# Patient Record
Sex: Female | Born: 1980 | Race: Black or African American | Hispanic: No | Marital: Single | Smoking: Former smoker
Health system: Southern US, Community
[De-identification: ages and names within clinical notes are randomized; demographics above are authoritative.]

---

## 2007-08-14 ENCOUNTER — Inpatient Hospital Stay (HOSPITAL_COMMUNITY): Admission: AD | Admit: 2007-08-14 | Discharge: 2007-08-21 | Payer: Self-pay | Admitting: *Deleted

## 2007-08-14 ENCOUNTER — Ambulatory Visit: Payer: Self-pay | Admitting: *Deleted
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
## 2011-04-01 LAB — COMPREHENSIVE METABOLIC PANEL
ALT: 10
CO2: 26
Calcium: 9.1
Creatinine, Ser: 0.98
GFR calc non Af Amer: >60
Glucose, Bld: 84
Sodium: 139
Total Protein: 6.6

## 2011-04-01 LAB — DRUGS OF ABUSE SCREEN W/O ALC, ROUTINE URINE
Benzodiazepines.: NEGATIVE
Cocaine Metabolites: NEGATIVE
Creatinine,U: 231.8
Marijuana Metabolite: POSITIVE — AB
Opiate Screen, Urine: NEGATIVE
Propoxyphene: NEGATIVE

## 2011-04-01 LAB — TSH: TSH: 1.2

## 2016-06-13 ENCOUNTER — Emergency Department
Admission: EM | Admit: 2016-06-13 | Discharge: 2016-06-13 | Disposition: A | Discharge status: Home / Self Care | Payer: Medicaid Other | Attending: Emergency Medicine | Admitting: Emergency Medicine

## 2016-06-13 ENCOUNTER — Emergency Department: Payer: Medicaid Other

## 2016-06-13 ENCOUNTER — Encounter: Payer: Self-pay | Admitting: Emergency Medicine

## 2016-06-13 DIAGNOSIS — O209 Hemorrhage in early pregnancy, unspecified: Secondary | ICD-10-CM | POA: Diagnosis present

## 2016-06-13 DIAGNOSIS — F1290 Cannabis use, unspecified, uncomplicated: Secondary | ICD-10-CM | POA: Diagnosis not present

## 2016-06-13 DIAGNOSIS — Z87891 Personal history of nicotine dependence: Secondary | ICD-10-CM | POA: Diagnosis not present

## 2016-06-13 DIAGNOSIS — Z3A10 10 weeks gestation of pregnancy: Secondary | ICD-10-CM | POA: Insufficient documentation

## 2016-06-13 DIAGNOSIS — N3000 Acute cystitis without hematuria: Secondary | ICD-10-CM | POA: Diagnosis not present

## 2016-06-13 DIAGNOSIS — O219 Vomiting of pregnancy, unspecified: Secondary | ICD-10-CM | POA: Insufficient documentation

## 2016-06-13 DIAGNOSIS — R102 Pelvic and perineal pain: Secondary | ICD-10-CM

## 2016-06-13 DIAGNOSIS — O200 Threatened abortion: Secondary | ICD-10-CM

## 2016-06-13 LAB — URINALYSIS COMPLETE WITH MICROSCOPIC (ARMC ONLY)
Bilirubin Urine: NEGATIVE
GLUCOSE, UA: NEGATIVE mg/dL
HGB URINE DIPSTICK: NEGATIVE
LEUKOCYTES UA: NEGATIVE
NITRITE: POSITIVE — AB
Protein, ur: 30 mg/dL — AB
SPECIFIC GRAVITY, URINE: 1.032 — AB (ref 1.005–1.030)
pH: 5 (ref 5.0–8.0)

## 2016-06-13 LAB — COMPREHENSIVE METABOLIC PANEL
ALT: 15 U/L (ref 14–54)
ANION GAP: 7 (ref 5–15)
AST: 14 U/L — ABNORMAL LOW (ref 15–41)
Albumin: 4.4 g/dL (ref 3.5–5.0)
Alkaline Phosphatase: 50 U/L (ref 38–126)
BILIRUBIN TOTAL: 0.7 mg/dL (ref 0.3–1.2)
BUN: 10 mg/dL (ref 6–20)
CO2: 23 mmol/L (ref 22–32)
Calcium: 9.4 mg/dL (ref 8.9–10.3)
Chloride: 104 mmol/L (ref 101–111)
Creatinine, Ser: 0.71 mg/dL (ref 0.44–1.00)
GFR calc Af Amer: >60 mL/min (ref >60)
Glucose, Bld: 87 mg/dL (ref 65–99)
POTASSIUM: 3.6 mmol/L (ref 3.5–5.1)
Sodium: 134 mmol/L — ABNORMAL LOW (ref 135–145)
TOTAL PROTEIN: 7.7 g/dL (ref 6.5–8.1)

## 2016-06-13 LAB — CBC
HEMATOCRIT: 38.4 % (ref 35.0–47.0)
HEMOGLOBIN: 13.5 g/dL (ref 12.0–16.0)
MCH: 31.7 pg (ref 26.0–34.0)
MCHC: 35.1 g/dL (ref 32.0–36.0)
MCV: 90.1 fL (ref 80.0–100.0)
Platelets: 241 10*3/uL (ref 150–440)
RBC: 4.26 MIL/uL (ref 3.80–5.20)
RDW: 13.5 % (ref 11.5–14.5)
WBC: 7.4 10*3/uL (ref 3.6–11.0)

## 2016-06-13 LAB — ABO/RH: ABO/RH(D): B POS

## 2016-06-13 LAB — HCG, QUANTITATIVE, PREGNANCY: HCG, BETA CHAIN, QUANT, S: 149022 m[IU]/mL — AB (ref <5)

## 2016-06-13 MED ORDER — SODIUM CHLORIDE 0.9 % IV SOLN
Freq: Once | INTRAVENOUS | Status: AC
  Administered 2016-06-13: 12:00:00 via INTRAVENOUS

## 2016-06-13 MED ORDER — ONDANSETRON HCL 4 MG/2ML IJ SOLN
4.0000 mg | Freq: Once | INTRAMUSCULAR | Status: AC
  Administered 2016-06-13: 4 mg via INTRAVENOUS
  Filled 2016-06-13: qty 2

## 2016-06-13 MED ORDER — NITROFURANTOIN MONOHYD MACRO 100 MG PO CAPS: 100.0000 mg | ORAL_CAPSULE | Freq: Two times a day (BID) | ORAL | 0 refills | Status: DC

## 2016-06-13 MED ORDER — ONDANSETRON 4 MG PO TBDP: 4.0000 mg | ORAL_TABLET | Freq: Three times a day (TID) | ORAL | 0 refills | Status: DC | PRN

## 2016-06-13 NOTE — ED Triage Notes (Signed)
Pt reports found out she was pregnant in October and since then has had lower abdominal pain, intermittent headaches, vaginal bleeding intermittently and nausea and vomitting. Pt reports no prenatal care yet, states she just moved here and was waiting on medicaid to make her first appointment.

## 2016-06-13 NOTE — ED Provider Notes (Signed)
[facility] Emergency Department Provider Note        Time seen: ----------------------------------------- 11:40 AM on 06/13/2016 -----------------------------------------    I have reviewed the triage vital signs and the nursing notes.   HISTORY  Chief Complaint Abdominal Pain; Vaginal Bleeding; Headache; Nausea; and Emesis    HPI Karina FreshwaterJoanna Doyle is a 35 y.o. female who presents to the ER forintractable vomiting. Patient states she is approximately [redacted] weeks pregnant with lower abdominal pain and intermittent vaginal spotting with nausea and vomiting. Patient states she's not had any prenatal care, she is G5 P4 AB 0. Patient states she just moved here and is waiting to get Medicaid for her first appointment.   History reviewed. No pertinent past medical history.  There are no active problems to display for this patient.   History reviewed. No pertinent surgical history.  Allergies Patient has no known allergies.  Social History Social History  Substance Use Topics  . Smoking status: Former Games developermoker  . Smokeless tobacco: Not on file  . Alcohol use No    Review of Systems Constitutional: Negative for fever. Cardiovascular: Negative for chest pain. Respiratory: Negative for shortness of breath. Gastrointestinal: Positive for abdominal pain, vomiting Genitourinary: Negative for dysuria.Positive for vaginal bleeding Musculoskeletal: Negative for back pain. Skin: Negative for rash. Neurological: Negative for headaches, focal weakness or numbness.  10-point ROS otherwise negative.  ____________________________________________   PHYSICAL EXAM:  VITAL SIGNS: ED Triage Vitals  Enc Vitals Group     BP 06/13/16 1029 (!) 103/35     Pulse Rate 06/13/16 1029 76     Resp 06/13/16 1029 20     Temp 06/13/16 1029 98.8 F (37.1 C)     Temp Source 06/13/16 1029 Oral     SpO2 06/13/16 1029 98 %     Weight 06/13/16 1030 150 lb (68 kg)     Height  06/13/16 1030 5\' 5"  (1.651 m)     Head Circumference --      Peak Flow --      Pain Score 06/13/16 1030 5     Pain Loc --      Pain Edu? --      Excl. in GC? --     Constitutional: Alert and oriented. Well appearing and in no distress. Eyes: Conjunctivae are normal. PERRL. Normal extraocular movements. ENT   Head: Normocephalic and atraumatic.   Nose: No congestion/rhinnorhea.   Mouth/Throat: Mucous membranes are moist.   Neck: No stridor. Cardiovascular: Normal rate, regular rhythm. No murmurs, rubs, or gallops. Respiratory: Normal respiratory effort without tachypnea nor retractions. Breath sounds are clear and equal bilaterally. No wheezes/rales/rhonchi. Gastrointestinal: Mild lower abdominal tenderness, no rebound or guarding. Normal bowel sounds. Musculoskeletal: Nontender with normal range of motion in all extremities. No lower extremity tenderness nor edema. Neurologic:  Normal speech and language. No gross focal neurologic deficits are appreciated.  Skin:  Skin is warm, dry and intact. No rash noted. Psychiatric: Mood and affect are normal. Speech and behavior are normal.  ____________________________________________  ED COURSE:  Pertinent labs & imaging results that were available during my care of the patient were reviewed by me and considered in my medical decision making (see chart for details). Clinical Course   Patient presents to the ER with abdominal pain and vomiting in pregnancy. We will assess with labs and ultrasound.  Procedures ____________________________________________   LABS (pertinent positives/negatives)  Labs Reviewed  COMPREHENSIVE METABOLIC PANEL - Abnormal; Notable for the following:  Result Value   Sodium 134 (*)    AST 14 (*)    All other components within normal limits  URINALYSIS COMPLETEWITH MICROSCOPIC ([facility] ONLY) - Abnormal; Notable for the following:    Color, Urine AMBER (*)    APPearance CLOUDY (*)    Ketones, ur  2+ (*)    Specific Gravity, Urine 1.032 (*)    Protein, ur 30 (*)    Nitrite POSITIVE (*)    Bacteria, UA MANY (*)    Squamous Epithelial / LPF 6-30 (*)    All other components within normal limits  HCG, QUANTITATIVE, PREGNANCY - Abnormal; Notable for the following:    hCG, Beta Chain, Quant, S [id number] (*)    All other components within normal limits  CBC  ABO/RH    RADIOLOGY  Pregnancy ultrasound IMPRESSION: 1. Single live intrauterine pregnancy of 10 weeks, 4 days . 2. No acute process identified. No evidence of ovarian torsion at this time. ____________________________________________  FINAL ASSESSMENT AND PLAN  Threatened miscarriage, cystitis  Plan: Patient with labs and imaging as dictated above. Patient is in no distress, feeling better after fluids and Zofran. She'll be discharged with Macrobid and Zofran and encouraged to have close follow-up with her doctor.   Emily FilbertWilliams, Ariea Rochin E, MD   Note: This dictation was prepared with Dragon dictation. Any transcriptional errors that result from this process are unintentional    Emily FilbertJonathan E Tatelyn Vanhecke, MD 06/13/16 1340

## 2016-06-13 NOTE — ED Notes (Signed)
Patient left for ultrasound.

## 2016-06-13 NOTE — ED Notes (Signed)
Patient is back from ultra sound.

## 2016-12-10 DIAGNOSIS — S4991XA Unspecified injury of right shoulder and upper arm, initial encounter: Secondary | ICD-10-CM | POA: Diagnosis not present

## 2016-12-10 DIAGNOSIS — S4992XA Unspecified injury of left shoulder and upper arm, initial encounter: Secondary | ICD-10-CM | POA: Diagnosis not present

## 2016-12-10 DIAGNOSIS — S8992XA Unspecified injury of left lower leg, initial encounter: Secondary | ICD-10-CM | POA: Diagnosis not present

## 2016-12-10 DIAGNOSIS — S299XXA Unspecified injury of thorax, initial encounter: Secondary | ICD-10-CM | POA: Diagnosis not present

## 2017-09-30 ENCOUNTER — Emergency Department
Admission: EM | Admit: 2017-09-30 | Discharge: 2017-10-02 | Disposition: A | Discharge status: Home / Self Care | Payer: Medicaid Other | Attending: Emergency Medicine | Admitting: Emergency Medicine

## 2017-09-30 ENCOUNTER — Other Ambulatory Visit: Payer: Self-pay

## 2017-09-30 ENCOUNTER — Encounter: Payer: Self-pay | Admitting: *Deleted

## 2017-09-30 DIAGNOSIS — Z87891 Personal history of nicotine dependence: Secondary | ICD-10-CM | POA: Diagnosis not present

## 2017-09-30 DIAGNOSIS — F4325 Adjustment disorder with mixed disturbance of emotions and conduct: Secondary | ICD-10-CM | POA: Diagnosis not present

## 2017-09-30 DIAGNOSIS — N3001 Acute cystitis with hematuria: Secondary | ICD-10-CM | POA: Diagnosis not present

## 2017-09-30 DIAGNOSIS — R451 Restlessness and agitation: Secondary | ICD-10-CM | POA: Diagnosis present

## 2017-09-30 DIAGNOSIS — F1220 Cannabis dependence, uncomplicated: Secondary | ICD-10-CM | POA: Diagnosis present

## 2017-09-30 DIAGNOSIS — Z79899 Other long term (current) drug therapy: Secondary | ICD-10-CM | POA: Insufficient documentation

## 2017-09-30 LAB — COMPREHENSIVE METABOLIC PANEL
ALT: 32 U/L (ref 14–54)
AST: 25 U/L (ref 15–41)
Albumin: 5.1 g/dL — ABNORMAL HIGH (ref 3.5–5.0)
Alkaline Phosphatase: 107 U/L (ref 38–126)
Anion gap: 14 (ref 5–15)
BUN: 18 mg/dL (ref 6–20)
CHLORIDE: 107 mmol/L (ref 101–111)
CO2: 19 mmol/L — AB (ref 22–32)
CREATININE: 1.14 mg/dL — AB (ref 0.44–1.00)
Calcium: 9.8 mg/dL (ref 8.9–10.3)
GFR calc Af Amer: >60 mL/min (ref >60)
Glucose, Bld: 115 mg/dL — ABNORMAL HIGH (ref 65–99)
Potassium: 3.6 mmol/L (ref 3.5–5.1)
Sodium: 140 mmol/L (ref 135–145)
Total Bilirubin: 1 mg/dL (ref 0.3–1.2)
Total Protein: 9.7 g/dL — ABNORMAL HIGH (ref 6.5–8.1)

## 2017-09-30 LAB — CBC
HEMATOCRIT: 43.2 % (ref 35.0–47.0)
Hemoglobin: 14.8 g/dL (ref 12.0–16.0)
MCH: 30.6 pg (ref 26.0–34.0)
MCHC: 34.2 g/dL (ref 32.0–36.0)
MCV: 89.3 fL (ref 80.0–100.0)
Platelets: 346 10*3/uL (ref 150–440)
RBC: 4.84 MIL/uL (ref 3.80–5.20)
RDW: 13.5 % (ref 11.5–14.5)
WBC: 8.8 10*3/uL (ref 3.6–11.0)

## 2017-09-30 LAB — URINE DRUG SCREEN, QUALITATIVE (ARMC ONLY)
Amphetamines, Ur Screen: NOT DETECTED
BARBITURATES, UR SCREEN: NOT DETECTED
Benzodiazepine, Ur Scrn: NOT DETECTED
CANNABINOID 50 NG, UR ~~LOC~~: POSITIVE — AB
Cocaine Metabolite,Ur ~~LOC~~: NOT DETECTED
MDMA (Ecstasy)Ur Screen: NOT DETECTED
METHADONE SCREEN, URINE: NOT DETECTED
OPIATE, UR SCREEN: NOT DETECTED
Phencyclidine (PCP) Ur S: NOT DETECTED
Tricyclic, Ur Screen: NOT DETECTED

## 2017-09-30 LAB — POCT PREGNANCY, URINE: Preg Test, Ur: NEGATIVE

## 2017-09-30 LAB — ETHANOL

## 2017-09-30 LAB — SALICYLATE LEVEL

## 2017-09-30 LAB — ACETAMINOPHEN LEVEL
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
## 2017-10-01 LAB — URINALYSIS, COMPLETE (UACMP) WITH MICROSCOPIC
Bilirubin Urine: NEGATIVE
GLUCOSE, UA: NEGATIVE mg/dL
KETONES UR: 20 mg/dL — AB
NITRITE: POSITIVE — AB
PROTEIN: 100 mg/dL — AB
Specific Gravity, Urine: 1.021 (ref 1.005–1.030)
pH: 5 (ref 5.0–8.0)

## 2017-10-01 MED ORDER — GABAPENTIN 100 MG PO CAPS
200.0000 mg | ORAL_CAPSULE | Freq: Three times a day (TID) | ORAL | Status: DC
  Administered 2017-10-01 – 2017-10-02 (×3): 200 mg via ORAL
  Filled 2017-10-01 (×3): qty 2

## 2017-10-01 NOTE — ED Notes (Signed)
Report to include Situation, Background, Assessment, and Recommendations received from Amy B. RN. Patient alert and oriented, warm and dry, in no acute distress. Patient denies SI, HI, AVH and pain. Patient made aware of Q15 minute rounds and security cameras for their safety. Patient instructed to come to me with needs or concerns. 

## 2017-10-01 NOTE — ED Notes (Signed)

## 2017-10-01 NOTE — ED Notes (Signed)
Hourly rounding reveals patient in room. No complaints, stable, in no acute distress. Q15 minute rounds and monitoring via Security Cameras to continue.Snack and beverage given. 

## 2017-10-01 NOTE — ED Notes (Signed)
Hourly rounding reveals patient sleeping in room. No complaints, stable, in no acute distress. Q15 minute rounds and monitoring via Security Cameras to continue. 

## 2017-10-01 NOTE — ED Notes (Signed)
Pt speaking on the phone in the dayroom. Maintained on 15 minute checks and observation by security camera for safety.

## 2017-10-01 NOTE — ED Notes (Signed)
IVC 

## 2017-10-01 NOTE — ED Notes (Signed)
Pt tearful when speaking to this Clinical research associatewriter. Pt denies SI/HI. 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 Pt believes her life is over. 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 This will be on my record. Everyone thinks I am disgusting."  Pt is concerned about leaving her boyfriend because she will have to care for 5 children on her own. Maintained on 15 minute checks and observation by security camera for safety.

## 2017-10-01 NOTE — ED Notes (Addendum)
Pt angry her pain from a c-section scar had not been addressed. Pt did not inform this writer of any pain. RN to speak with EDP about ordering gabapentin (PTA med). Pt also wanting her discharge paperwork. RN explained she was under IVC and only the doctor could reverse this and allow her to leave. Pt continued to tell RN she was going to get her clothes and leave. "Who is going to stop me? What are they gonna do?"   Pt demanding to speak with someone else about her discharge.   Maintained on 15 minute checks and observation by security camera for safety.

## 2017-10-01 NOTE — ED Provider Notes (Signed)
-----------------------------------------   3:45 AM on 10/01/2017 -----------------------------------------  [facility]'s [facility].  No medication recommendations at this time.   Irean HongSung, Stephaniemarie Stoffel J, MD 10/01/17 [phone]

## 2017-10-01 NOTE — ED Notes (Signed)
Hourly rounding reveals patient in room. No complaints, stable, in no acute distress. Q15 minute rounds and monitoring via Security Cameras to continue. 

## 2017-10-01 NOTE — ED Notes (Signed)
Pt in shower at this time, meal tray and juice placed on bed.
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
## 2017-10-02 ENCOUNTER — Encounter: Payer: Self-pay | Admitting: Psychiatry

## 2017-10-02 DIAGNOSIS — F4325 Adjustment disorder with mixed disturbance of emotions and conduct: Secondary | ICD-10-CM

## 2017-10-02 DIAGNOSIS — F1220 Cannabis dependence, uncomplicated: Secondary | ICD-10-CM | POA: Diagnosis present

## 2017-10-02 MED ORDER — CEPHALEXIN 500 MG PO CAPS
500.0000 mg | ORAL_CAPSULE | Freq: Four times a day (QID) | ORAL | Status: DC
  Administered 2017-10-02 (×2): 500 mg via ORAL
  Filled 2017-10-02 (×3): qty 1

## 2017-10-02 MED ORDER — CEPHALEXIN 500 MG PO CAPS: 500.0000 mg | ORAL_CAPSULE | Freq: Three times a day (TID) | ORAL | 0 refills | Status: AC

## 2017-10-02 NOTE — ED Provider Notes (Signed)
-----------------------------------------   2:24 PM on 10/02/2017 -----------------------------------------   Blood pressure 119/69, pulse 88, temperature 98.4 F (36.9 C), temperature source Oral, resp. rate 16, height 5\' 5"  (1.651 m), weight 89.8 kg (198 lb), SpO2 100 %, unknown if currently breastfeeding.  Patient has been evaluated by psychiatry and cleared for discharge. IVC lifted by Dr. Jennet MaduroPucilowska. Patient's labs have been reviewed with no acute findings. Patient will be discharged at this time to home     Don PerkingVeronese, [place], MD 10/02/17 1424

## 2017-10-02 NOTE — ED Notes (Signed)
Hourly rounding reveals patient sleeping in room. No complaints, stable, in no acute distress. Q15 minute rounds and monitoring via Security Cameras to continue. 

## 2017-10-02 NOTE — ED Notes (Signed)
Hourly rounding reveals patient in room. No complaints, stable, in no acute distress. Q15 minute rounds and monitoring via Security Cameras to continue. 

## 2017-10-02 NOTE — Discharge Instructions (Addendum)
You have been seen in the Emergency Department (ED)  today for a psychiatric complaint.  You have been evaluated by psychiatry and we believe you are safe to be discharged from the hospital.   ° °Please return to the Emergency Department (ED)  immediately if you have ANY thoughts of hurting yourself or anyone else, so that we may help you. ° °Please avoid alcohol and drug use. ° °Follow up with your doctor and/or therapist as soon as possible regarding today's ED  visit.  ° °You may call crisis hotline for [place] at [phone]. ° °
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
## 2017-10-02 NOTE — ED Notes (Signed)
Pt taking shower now

## 2017-10-02 NOTE — ED Notes (Signed)
Hourly rounding reveals patient in rest room. No complaints, stable, in no acute distress. Q15 minute rounds and monitoring via Security Cameras to continue. 
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
## 2017-10-02 NOTE — ED Notes (Addendum)
Pt. Requested and given water.

## 2017-10-02 NOTE — ED Notes (Signed)
IVC  RESCINDED  INFORMED  [facility])

## 2017-10-02 NOTE — ED Notes (Signed)
Patient voices understanding of discharge instructions, prescription given for ABT.for UTI. Patient received all of her belongings, Patient has family member that is going to pick her up and transfer home.

## 2017-10-02 NOTE — ED Provider Notes (Signed)
-----------------------------------------   8:30 AM on 10/02/2017 -----------------------------------------   Blood pressure 119/69, pulse 88, temperature 98.4 F (36.9 C), temperature source Oral, resp. rate 16, height 5\' 5"  (1.651 m), weight 89.8 kg (198 lb), SpO2 100 %, unknown if currently breastfeeding.  The patient had no acute events since last update.  Calm and cooperative at this time.  Disposition is pending per Psychiatry/Behavioral Medicine team recommendations.     Don PerkingVeronese, [place], MD 10/02/17 0830

## 2017-10-02 NOTE — Consult Note (Signed)
  [name] was brought to the ER for suicidal and homicidal ideation. She is no longer a danger to herself or others.  Full consult to follow. Case discussed with Dr. Don PerkingVeronese  PLAN: 1. Please discharge as appropriate 2. No medications recommended. 3/ Pt not interested in follow up with mental health professional.

## 2017-10-04 LAB — URINE CULTURE: Culture: >=100000 — AB

## 2017-10-19 DIAGNOSIS — B977 Papillomavirus as the cause of diseases classified elsewhere: Secondary | ICD-10-CM | POA: Diagnosis not present

## 2017-10-19 DIAGNOSIS — R8789 Other abnormal findings in specimens from female genital organs: Secondary | ICD-10-CM | POA: Diagnosis not present

## 2017-12-16 IMAGING — US US ART/VEN ABD/PELV/SCROTUM DOPPLER LTD
1 series · 13 of 25 positions shown · non-contrast
Comparison: None.

CLINICAL DATA: 35 y/o  F; pelvic pain and bleeding.



[Series 1: us art/ven abd/pelv/scrotum doppler ltd · 0.20mm/px · 13 of 77 slices shown]
[im 1/77]
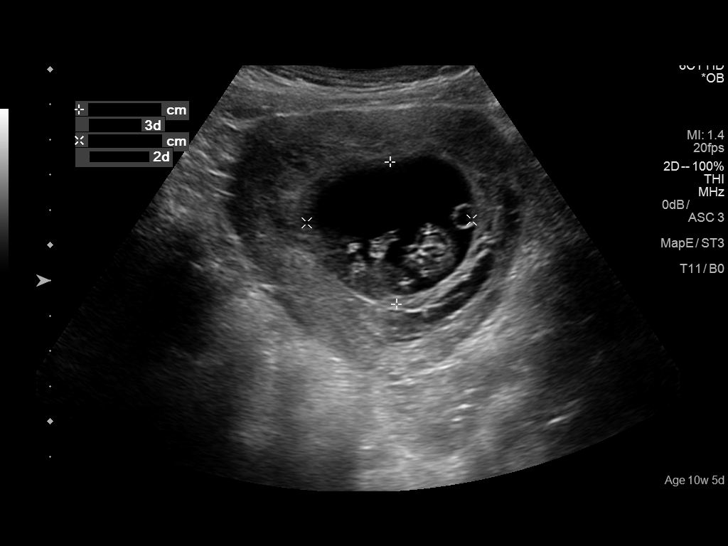
[im 7/77]
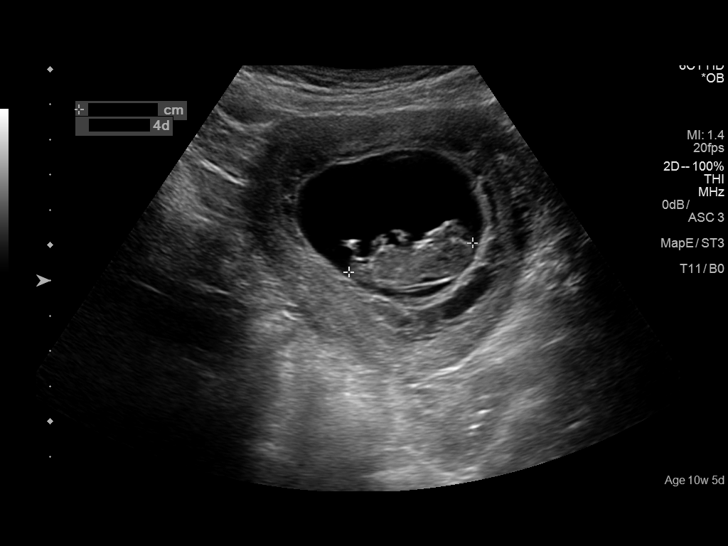
[im 13/77]
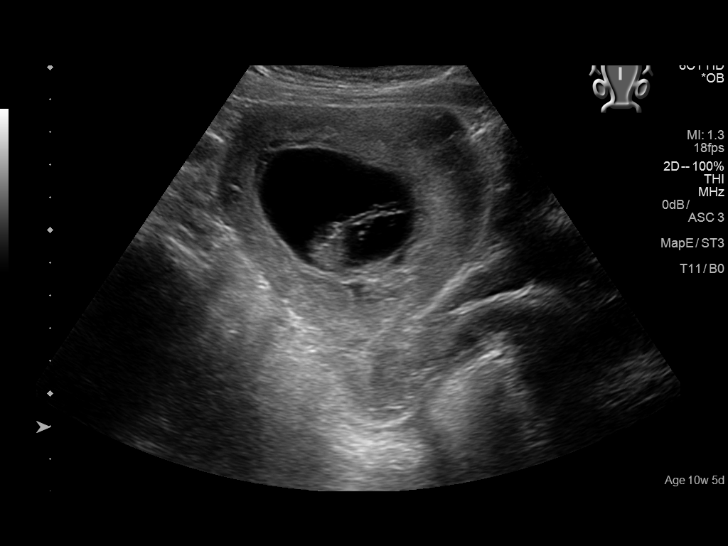
[im 20/77]
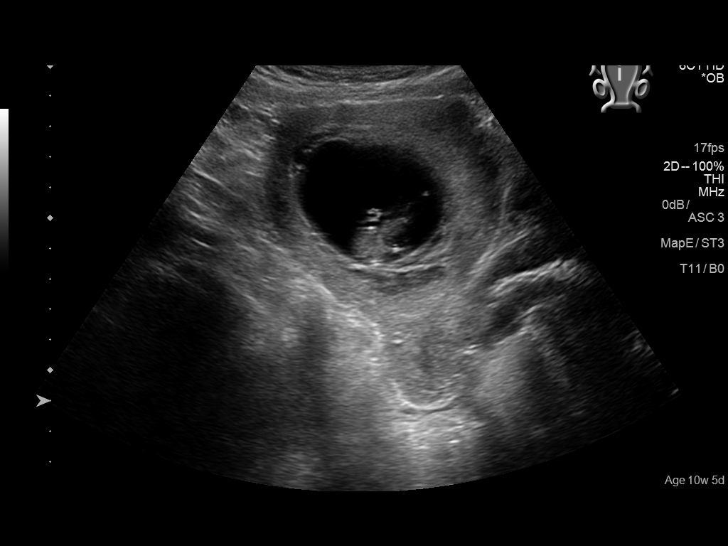
[im 26/77]
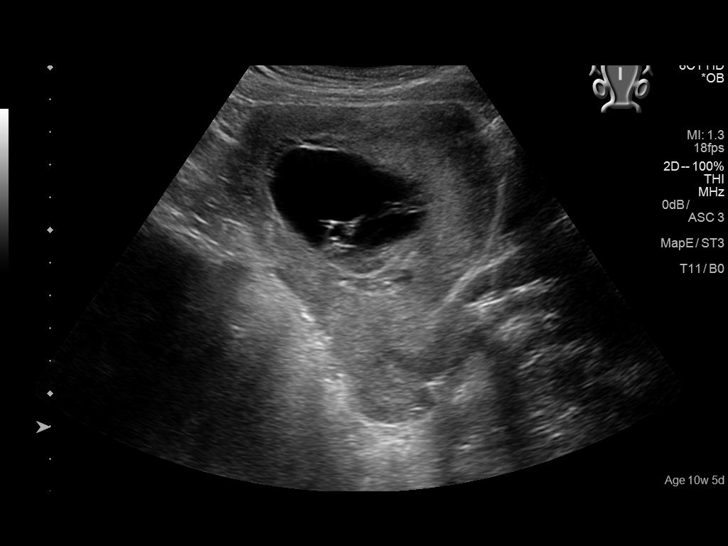
[im 32/77]
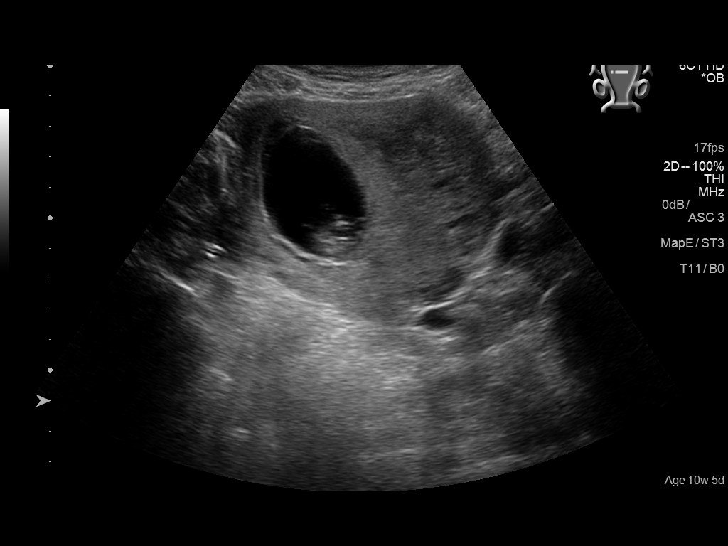
[im 39/77]
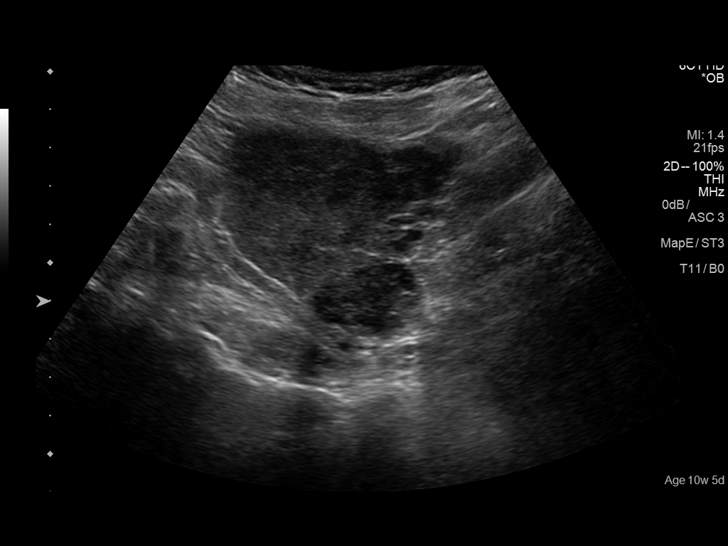
[im 45/77]
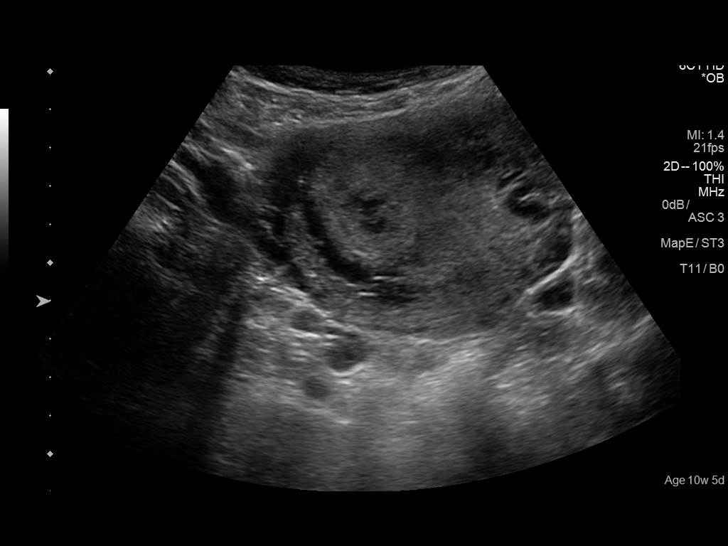
[im 51/77]
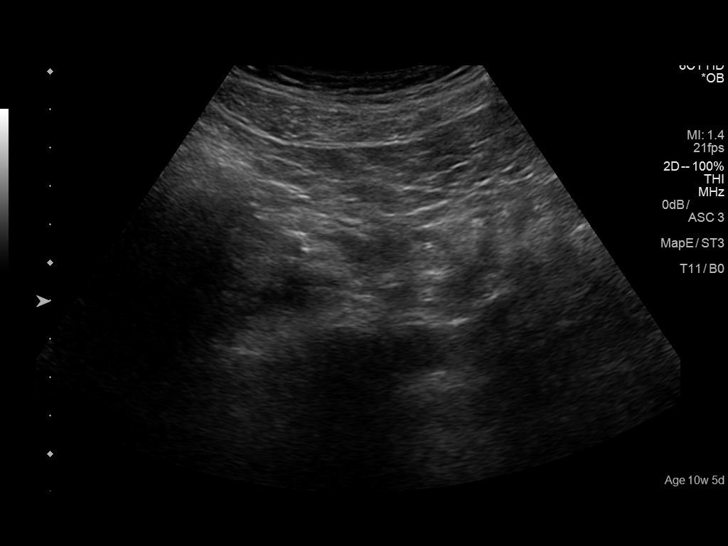
[im 58/77]
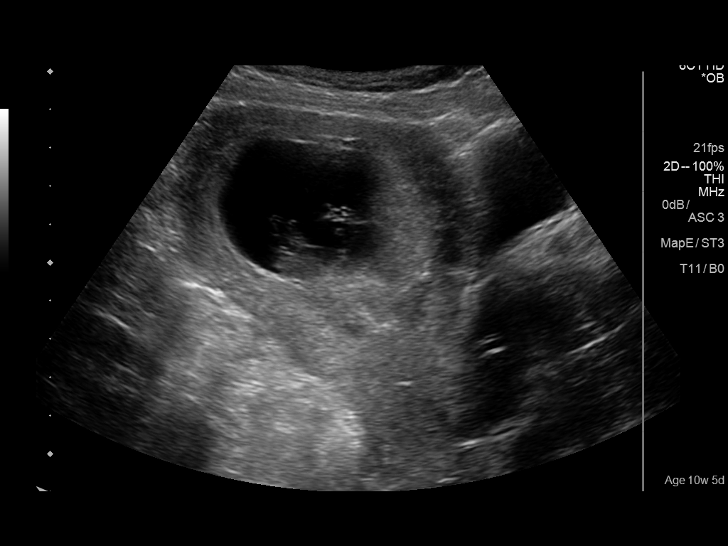
[im 64/77]
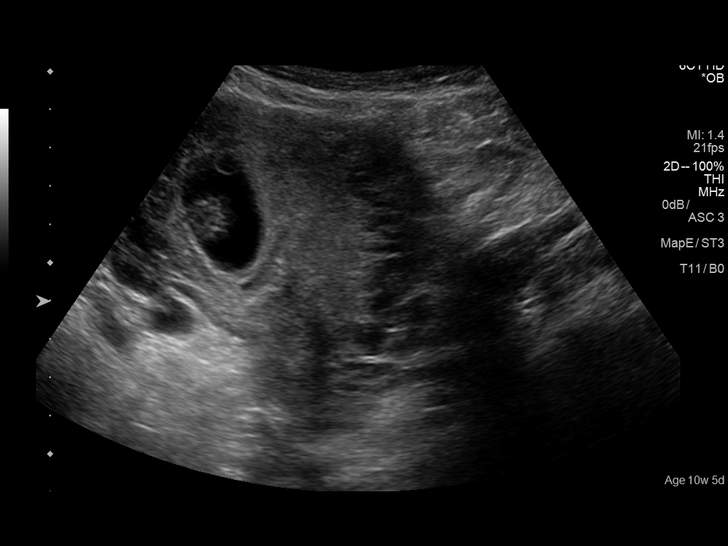
[im 70/77]
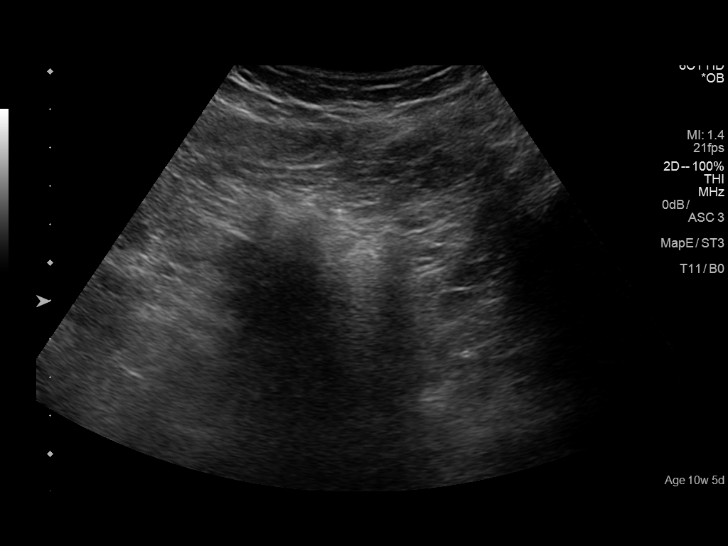
[im 77/77]
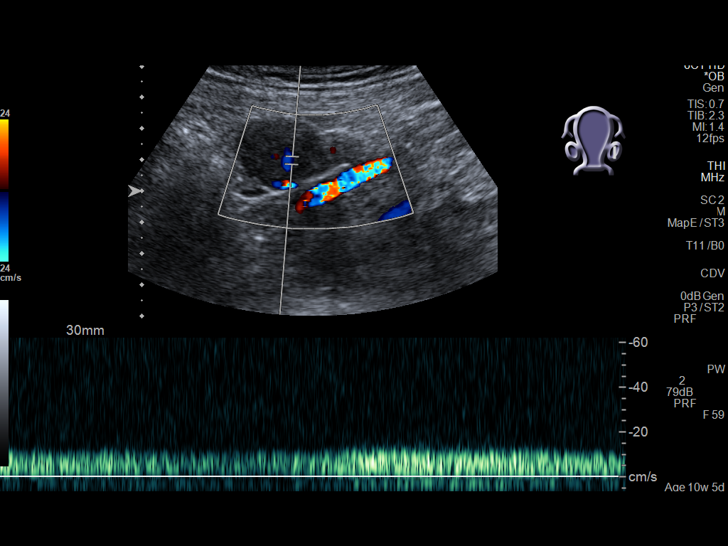

[13 of 25 positions shown; findings below may reference images not displayed]

FINDINGS: Intrauterine gestational sac: Single

Yolk sac:  Visualized.

Embryo:  Visualized.

Cardiac Activity: Visualized.

Heart Rate: 155 bpm

CRL:   36.2  mm   10 w 4 d                  US EDC: 01/05/2017

Subchorionic hemorrhage:  None visualized.

Maternal uterus/adnexae: Bilateral ovaries are normal with the right
measuring 2.6 x 1.4 x 2.2 cm and the left measuring 3.5 x 2.5 x
cm.

Pulsed Doppler evaluation of both ovaries demonstrates normal
appearing low-resistance arterial and venous waveforms.
IMPRESSION: 1. Single live intrauterine pregnancy.
2. No acute process identified. No evidence of ovarian torsion at
this time.

By: Ebadat Tiger M.D.

## 2018-03-13 DIAGNOSIS — Z3042 Encounter for surveillance of injectable contraceptive: Secondary | ICD-10-CM | POA: Diagnosis not present

## 2018-04-20 DIAGNOSIS — Z23 Encounter for immunization: Secondary | ICD-10-CM | POA: Diagnosis not present

## 2018-05-29 DIAGNOSIS — Z3042 Encounter for surveillance of injectable contraceptive: Secondary | ICD-10-CM | POA: Diagnosis not present

## 2018-08-16 DIAGNOSIS — Z3042 Encounter for surveillance of injectable contraceptive: Secondary | ICD-10-CM | POA: Diagnosis not present

## 2018-11-06 DIAGNOSIS — Z3042 Encounter for surveillance of injectable contraceptive: Secondary | ICD-10-CM | POA: Diagnosis not present

## 2018-11-23 ENCOUNTER — Other Ambulatory Visit: Payer: Self-pay

## 2018-11-23 ENCOUNTER — Encounter: Payer: Self-pay | Admitting: Emergency Medicine

## 2018-11-23 ENCOUNTER — Emergency Department
Admission: EM | Admit: 2018-11-23 | Discharge: 2018-11-23 | Disposition: A | Discharge status: Home / Self Care | Payer: Medicaid Other | Attending: Emergency Medicine | Admitting: Emergency Medicine

## 2018-11-23 DIAGNOSIS — Z87891 Personal history of nicotine dependence: Secondary | ICD-10-CM | POA: Insufficient documentation

## 2018-11-23 DIAGNOSIS — R1030 Lower abdominal pain, unspecified: Secondary | ICD-10-CM | POA: Insufficient documentation

## 2018-11-23 LAB — URINALYSIS, COMPLETE (UACMP) WITH MICROSCOPIC
Bacteria, UA: NONE SEEN
Bilirubin Urine: NEGATIVE
Glucose, UA: NEGATIVE mg/dL
Hgb urine dipstick: NEGATIVE
Ketones, ur: NEGATIVE mg/dL
Leukocytes,Ua: NEGATIVE
Nitrite: NEGATIVE
Protein, ur: NEGATIVE mg/dL
Specific Gravity, Urine: 1.025 (ref 1.005–1.030)
pH: 5 (ref 5.0–8.0)

## 2018-11-23 LAB — COMPREHENSIVE METABOLIC PANEL
ALT: 12 U/L (ref 0–44)
AST: 14 U/L — ABNORMAL LOW (ref 15–41)
Albumin: 4.6 g/dL (ref 3.5–5.0)
Alkaline Phosphatase: 72 U/L (ref 38–126)
Anion gap: 12 (ref 5–15)
BUN: 8 mg/dL (ref 6–20)
CO2: 19 mmol/L — ABNORMAL LOW (ref 22–32)
Calcium: 9.4 mg/dL (ref 8.9–10.3)
Chloride: 109 mmol/L (ref 98–111)
Creatinine, Ser: 0.96 mg/dL (ref 0.44–1.00)
GFR calc Af Amer: >60 mL/min (ref >60)
GFR calc non Af Amer: >60 mL/min (ref >60)
Glucose, Bld: 99 mg/dL (ref 70–99)
Potassium: 3.8 mmol/L (ref 3.5–5.1)
Sodium: 140 mmol/L (ref 135–145)
Total Bilirubin: 0.5 mg/dL (ref 0.3–1.2)
Total Protein: 7.5 g/dL (ref 6.5–8.1)

## 2018-11-23 LAB — CBC
HCT: 39.9 % (ref 36.0–46.0)
Hemoglobin: 14 g/dL (ref 12.0–15.0)
MCH: 31.4 pg (ref 26.0–34.0)
MCHC: 35.1 g/dL (ref 30.0–36.0)
MCV: 89.5 fL (ref 80.0–100.0)
Platelets: 249 10*3/uL (ref 150–400)
RBC: 4.46 MIL/uL (ref 3.87–5.11)
RDW: 13.2 % (ref 11.5–15.5)
WBC: 5.4 10*3/uL (ref 4.0–10.5)
nRBC: 0 % (ref 0.0–0.2)

## 2018-11-23 LAB — POCT PREGNANCY, URINE: Preg Test, Ur: NEGATIVE

## 2018-11-23 LAB — LIPASE, BLOOD: Lipase: 25 U/L (ref 11–51)

## 2018-11-23 NOTE — Discharge Instructions (Signed)
Your exam and labs are reassuring at this time. You may be experiencing local pain and sensitivity at your previous C-section scar due to adhesions or hypersensitive nerves. You have no indication of a hernia, infection, or wound opening. Take OTC ibuprofen or Tylenol as needed. Follow-up with your primary care provider as needed.

## 2018-11-23 NOTE — ED Notes (Signed)
Pt left exam room without signing signature pad

## 2018-11-23 NOTE — ED Triage Notes (Signed)
Patient presents to the ED after a fall yesterday.  Patient states she was carrying a box and fell and the box hit her c-section scar.  Patient reports severe cramping at the site of the scar.  Patient states she has had pain to area ever since her c-section 2 years ago.  Patient states the pain yesterday lasted for several hours.  Patient states now her abdomen is cramping intermittently (more to the right side).  Patient states pain is radiating down right leg.  Patient denies vomiting and diarrhea.  Patient states abdomen is tender to touch.

## 2018-11-23 NOTE — ED Notes (Signed)
Pt reports she had a C-section about 2 years ago and reports she feels she never heal well, reports yesterday she was moving some items and feel on her lower abdomen reports now has pain feels like cramps and muscles spams, pt talks in complete sentences no distress noted

## 2018-11-24 NOTE — ED Provider Notes (Signed)
[facility] Emergency Department Provider Note ____________________________________________  Time seen: 71  I have reviewed the triage vital signs and the nursing notes.  HISTORY  Chief Complaint  Fall  HPI [name] is a 38 y.o. female presents herself to the ED for evaluation of pain to the lower abdomen at her C-section scar. She describes carrying a box yesterday and she fell, causing the box to hit her across the lower abdomen at the scar. She notes cramping at the scar, but denies nausea, vomiting, or diarrhea. She is also without any laceration or abrasion to the scar. She reports ongoing scar tenderness since her c-section, 2 years ago.   History reviewed. No pertinent past medical history.  Patient Active Problem List   Diagnosis Date Noted  . 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 Cannabis use disorder, moderate, dependence (HCC) 10/02/2017    History reviewed. No pertinent surgical history.  Prior to Admission medications   Not on File    Allergies Sertraline hcl  No family history on file.  Social History Social History   Tobacco Use  . Smoking status: Former Games developer  . Smokeless tobacco: Never Used  Substance Use Topics  . Alcohol use: No  . Drug use: Yes    Types: Marijuana    Review of Systems  Constitutional: Negative for fever. Eyes: Negative for visual changes. ENT: Negative for sore throat. Cardiovascular: Negative for chest pain. Respiratory: Negative for shortness of breath. Gastrointestinal: Negative for abdominal pain, vomiting and diarrhea. Reports abdominal wall pain as above.  Genitourinary: Negative for dysuria. Musculoskeletal: Negative for back pain. Skin: Negative for rash. Neurological: Negative for headaches, focal weakness or numbness. ____________________________________________  PHYSICAL EXAM:  VITAL SIGNS: ED Triage Vitals  Enc Vitals Group     BP  11/23/18 1328 123/71     Pulse Rate 11/23/18 1328 96     Resp 11/23/18 1804 16     Temp 11/23/18 1328 98.9 F (37.2 C)     Temp Source 11/23/18 1328 Oral     SpO2 11/23/18 1328 98 %     Weight 11/23/18 1330 170 lb (77.1 kg)     Height 11/23/18 1330 5\' 5"  (1.651 m)     Head Circumference --      Peak Flow --      Pain Score 11/23/18 1334 8     Pain Loc --      Pain Edu? --      Excl. in GC? --     Constitutional: Alert and oriented. Well appearing and in no distress. Head: Normocephalic and atraumatic. Eyes: Conjunctivae are normal. Normal extraocular movements Cardiovascular: Normal rate, regular rhythm. Normal distal pulses. Respiratory: Normal respiratory effort. No wheezes/rales/rhonchi. Gastrointestinal: Soft and nontender. No distention, rebound, guarding, or rigidity. Well-healed, flat, thin suprapubic c-section scar without hypertrophy or keloid. Mild tender to palpation over the scar line. No signs of redness, warmth, induration, or dehiscence. No bruising or ecchymosis noted to the abdominal wall.  Musculoskeletal: Nontender with normal range of motion in all extremities.  Neurologic:  Normal gait without ataxia. Normal speech and language. No gross focal neurologic deficits are appreciated. Skin:  Skin is warm, dry and intact. No rash noted. Psychiatric: Mood and affect are normal. Patient exhibits appropriate insight and judgment. ____________________________________________   LABS (pertinent positives/negatives) Labs Reviewed  COMPREHENSIVE METABOLIC PANEL - Abnormal; Notable for the following components:      Result Value   CO2 19 (*)  AST 14 (*)    All other components within normal limits  URINALYSIS, COMPLETE (UACMP) WITH MICROSCOPIC - Abnormal; Notable for the following components:   Color, Urine YELLOW (*)    APPearance HAZY (*)    All other components within normal limits  LIPASE, BLOOD  CBC  POCT PREGNANCY, URINE   ____________________________________________  PROCEDURES  Procedures ____________________________________________  INITIAL IMPRESSION / ASSESSMENT AND PLAN / ED COURSE  Ronnell FreshwaterJoanna Virden was evaluated in Emergency Department on 11/24/2018 for the symptoms described in the history of present illness. She was evaluated in the context of the global COVID-19 pandemic, which necessitated consideration that the patient might be at risk for infection with the SARS-CoV-2 virus that causes COVID-19. Institutional protocols and algorithms that pertain to the evaluation of patients at risk for COVID-19 are in a state of rapid change based on information released by regulatory bodies including the CDC and federal and state organizations. These policies and algorithms were followed during the patient's care in the ED.  Patient with ED evaluation of some mild pain to the lower abdomen at her surgical scar.  Patient describes blunt trauma to the lower area yesterday.  She reports intermittent symptoms described as cramping.  Exam is overall benign at this time.  Labs are without any acute infectious process, or any indication of acute abdominal process on exam.  Patient is discharged at this time [facility].  Possible etiologies for this pain could be underlying adhesions as well as a possible nerve entrapment.  The surgical scar itself is very fine and will heal without any signs of hypertrophy or keloid.  No signs of any local infection are appreciated.  She will follow-up with primary provider as discussed. ____________________________________________  FINAL CLINICAL IMPRESSION(S) / ED DIAGNOSES  Final diagnoses:  Abdominal wall pain in suprapubic region      Lissa HoardMenshew, Chyrl Elwell V Bacon, PA-C 11/24/18 0116    Jeanmarie PlantMcShane, James A, MD 11/24/18 2317

## 2019-01-15 DIAGNOSIS — Z124 Encounter for screening for malignant neoplasm of cervix: Secondary | ICD-10-CM | POA: Diagnosis not present

## 2019-01-22 DIAGNOSIS — Z3042 Encounter for surveillance of injectable contraceptive: Secondary | ICD-10-CM | POA: Diagnosis not present

## 2019-03-11 DIAGNOSIS — F419 Anxiety disorder, unspecified: Secondary | ICD-10-CM | POA: Diagnosis not present

## 2019-03-11 DIAGNOSIS — Z01419 Encounter for gynecological examination (general) (routine) without abnormal findings: Secondary | ICD-10-CM | POA: Diagnosis not present

## 2019-04-09 DIAGNOSIS — Z3042 Encounter for surveillance of injectable contraceptive: Secondary | ICD-10-CM | POA: Diagnosis not present

## 2019-07-16 DIAGNOSIS — Z3042 Encounter for surveillance of injectable contraceptive: Secondary | ICD-10-CM | POA: Diagnosis not present

## 2019-07-19 DIAGNOSIS — Z23 Encounter for immunization: Secondary | ICD-10-CM | POA: Diagnosis not present

## 2019-07-19 DIAGNOSIS — Z111 Encounter for screening for respiratory tuberculosis: Secondary | ICD-10-CM | POA: Diagnosis not present

## 2019-11-05 DIAGNOSIS — Z3042 Encounter for surveillance of injectable contraceptive: Secondary | ICD-10-CM | POA: Diagnosis not present

## 2020-05-18 DIAGNOSIS — Z01419 Encounter for gynecological examination (general) (routine) without abnormal findings: Secondary | ICD-10-CM | POA: Diagnosis not present

## 2020-05-18 DIAGNOSIS — Z113 Encounter for screening for infections with a predominantly sexual mode of transmission: Secondary | ICD-10-CM | POA: Diagnosis not present

## 2020-06-10 DIAGNOSIS — Z419 Encounter for procedure for purposes other than remedying health state, unspecified: Secondary | ICD-10-CM | POA: Diagnosis not present

## 2020-07-11 DIAGNOSIS — Z419 Encounter for procedure for purposes other than remedying health state, unspecified: Secondary | ICD-10-CM | POA: Diagnosis not present

## 2020-10-09 DIAGNOSIS — Z419 Encounter for procedure for purposes other than remedying health state, unspecified: Secondary | ICD-10-CM | POA: Diagnosis not present

## 2020-10-27 DIAGNOSIS — Z3042 Encounter for surveillance of injectable contraceptive: Secondary | ICD-10-CM | POA: Diagnosis not present

## 2020-11-08 DIAGNOSIS — Z419 Encounter for procedure for purposes other than remedying health state, unspecified: Secondary | ICD-10-CM | POA: Diagnosis not present

## 2020-12-09 DIAGNOSIS — Z419 Encounter for procedure for purposes other than remedying health state, unspecified: Secondary | ICD-10-CM | POA: Diagnosis not present

## 2021-01-08 DIAGNOSIS — Z419 Encounter for procedure for purposes other than remedying health state, unspecified: Secondary | ICD-10-CM | POA: Diagnosis not present

## 2021-01-12 DIAGNOSIS — Z3042 Encounter for surveillance of injectable contraceptive: Secondary | ICD-10-CM | POA: Diagnosis not present

## 2021-02-01 DIAGNOSIS — Z23 Encounter for immunization: Secondary | ICD-10-CM | POA: Diagnosis not present

## 2021-02-08 DIAGNOSIS — Z419 Encounter for procedure for purposes other than remedying health state, unspecified: Secondary | ICD-10-CM | POA: Diagnosis not present

## 2021-03-11 DIAGNOSIS — Z419 Encounter for procedure for purposes other than remedying health state, unspecified: Secondary | ICD-10-CM | POA: Diagnosis not present

## 2021-03-30 DIAGNOSIS — Z3042 Encounter for surveillance of injectable contraceptive: Secondary | ICD-10-CM | POA: Diagnosis not present

## 2021-04-10 DIAGNOSIS — Z419 Encounter for procedure for purposes other than remedying health state, unspecified: Secondary | ICD-10-CM | POA: Diagnosis not present

## 2021-05-11 DIAGNOSIS — Z419 Encounter for procedure for purposes other than remedying health state, unspecified: Secondary | ICD-10-CM | POA: Diagnosis not present

## 2021-06-10 DIAGNOSIS — Z419 Encounter for procedure for purposes other than remedying health state, unspecified: Secondary | ICD-10-CM | POA: Diagnosis not present

## 2021-07-06 DIAGNOSIS — Z3042 Encounter for surveillance of injectable contraceptive: Secondary | ICD-10-CM | POA: Diagnosis not present

## 2021-07-11 DIAGNOSIS — Z419 Encounter for procedure for purposes other than remedying health state, unspecified: Secondary | ICD-10-CM | POA: Diagnosis not present

## 2021-08-11 DIAGNOSIS — Z419 Encounter for procedure for purposes other than remedying health state, unspecified: Secondary | ICD-10-CM | POA: Diagnosis not present

## 2021-08-16 DIAGNOSIS — Z3042 Encounter for surveillance of injectable contraceptive: Secondary | ICD-10-CM | POA: Diagnosis not present

## 2021-08-16 DIAGNOSIS — Z23 Encounter for immunization: Secondary | ICD-10-CM | POA: Diagnosis not present

## 2021-08-16 DIAGNOSIS — Z01419 Encounter for gynecological examination (general) (routine) without abnormal findings: Secondary | ICD-10-CM | POA: Diagnosis not present

## 2021-09-08 DIAGNOSIS — Z419 Encounter for procedure for purposes other than remedying health state, unspecified: Secondary | ICD-10-CM | POA: Diagnosis not present

## 2021-09-21 DIAGNOSIS — Z3042 Encounter for surveillance of injectable contraceptive: Secondary | ICD-10-CM | POA: Diagnosis not present

## 2021-10-09 DIAGNOSIS — Z419 Encounter for procedure for purposes other than remedying health state, unspecified: Secondary | ICD-10-CM | POA: Diagnosis not present

## 2021-11-08 DIAGNOSIS — Z419 Encounter for procedure for purposes other than remedying health state, unspecified: Secondary | ICD-10-CM | POA: Diagnosis not present

## 2021-12-09 DIAGNOSIS — Z3042 Encounter for surveillance of injectable contraceptive: Secondary | ICD-10-CM | POA: Diagnosis not present

## 2021-12-09 DIAGNOSIS — Z419 Encounter for procedure for purposes other than remedying health state, unspecified: Secondary | ICD-10-CM | POA: Diagnosis not present

## 2021-12-14 DIAGNOSIS — Z23 Encounter for immunization: Secondary | ICD-10-CM | POA: Diagnosis not present

## 2021-12-14 DIAGNOSIS — Z111 Encounter for screening for respiratory tuberculosis: Secondary | ICD-10-CM | POA: Diagnosis not present

## 2021-12-24 ENCOUNTER — Ambulatory Visit: Payer: Medicaid Other

## 2022-01-08 DIAGNOSIS — Z419 Encounter for procedure for purposes other than remedying health state, unspecified: Secondary | ICD-10-CM | POA: Diagnosis not present

## 2022-02-08 DIAGNOSIS — Z419 Encounter for procedure for purposes other than remedying health state, unspecified: Secondary | ICD-10-CM | POA: Diagnosis not present

## 2022-03-11 DIAGNOSIS — Z419 Encounter for procedure for purposes other than remedying health state, unspecified: Secondary | ICD-10-CM | POA: Diagnosis not present

## 2022-04-10 DIAGNOSIS — Z419 Encounter for procedure for purposes other than remedying health state, unspecified: Secondary | ICD-10-CM | POA: Diagnosis not present

## 2022-05-11 DIAGNOSIS — Z419 Encounter for procedure for purposes other than remedying health state, unspecified: Secondary | ICD-10-CM | POA: Diagnosis not present

## 2022-05-17 DIAGNOSIS — Z3042 Encounter for surveillance of injectable contraceptive: Secondary | ICD-10-CM | POA: Diagnosis not present

## 2022-06-10 DIAGNOSIS — Z419 Encounter for procedure for purposes other than remedying health state, unspecified: Secondary | ICD-10-CM | POA: Diagnosis not present

## 2022-07-11 DIAGNOSIS — Z419 Encounter for procedure for purposes other than remedying health state, unspecified: Secondary | ICD-10-CM | POA: Diagnosis not present

## 2022-08-11 DIAGNOSIS — Z419 Encounter for procedure for purposes other than remedying health state, unspecified: Secondary | ICD-10-CM | POA: Diagnosis not present

## 2022-08-16 DIAGNOSIS — Z1231 Encounter for screening mammogram for malignant neoplasm of breast: Secondary | ICD-10-CM | POA: Diagnosis not present

## 2022-08-16 DIAGNOSIS — Z01419 Encounter for gynecological examination (general) (routine) without abnormal findings: Secondary | ICD-10-CM | POA: Diagnosis not present

## 2022-08-16 DIAGNOSIS — Z113 Encounter for screening for infections with a predominantly sexual mode of transmission: Secondary | ICD-10-CM | POA: Diagnosis not present

## 2022-09-09 DIAGNOSIS — Z419 Encounter for procedure for purposes other than remedying health state, unspecified: Secondary | ICD-10-CM | POA: Diagnosis not present

## 2022-10-10 DIAGNOSIS — Z419 Encounter for procedure for purposes other than remedying health state, unspecified: Secondary | ICD-10-CM | POA: Diagnosis not present

## 2022-10-20 DIAGNOSIS — Z3042 Encounter for surveillance of injectable contraceptive: Secondary | ICD-10-CM | POA: Diagnosis not present

## 2022-11-08 DIAGNOSIS — Z1231 Encounter for screening mammogram for malignant neoplasm of breast: Secondary | ICD-10-CM | POA: Diagnosis not present

## 2022-11-08 DIAGNOSIS — N6489 Other specified disorders of breast: Secondary | ICD-10-CM | POA: Diagnosis not present

## 2022-11-09 DIAGNOSIS — Z419 Encounter for procedure for purposes other than remedying health state, unspecified: Secondary | ICD-10-CM | POA: Diagnosis not present

## 2022-12-10 DIAGNOSIS — Z419 Encounter for procedure for purposes other than remedying health state, unspecified: Secondary | ICD-10-CM | POA: Diagnosis not present

## 2023-01-09 DIAGNOSIS — Z419 Encounter for procedure for purposes other than remedying health state, unspecified: Secondary | ICD-10-CM | POA: Diagnosis not present

## 2023-01-10 DIAGNOSIS — Z30013 Encounter for initial prescription of injectable contraceptive: Secondary | ICD-10-CM | POA: Diagnosis not present

## 2023-01-10 DIAGNOSIS — Z3042 Encounter for surveillance of injectable contraceptive: Secondary | ICD-10-CM | POA: Diagnosis not present

## 2023-02-09 DIAGNOSIS — Z419 Encounter for procedure for purposes other than remedying health state, unspecified: Secondary | ICD-10-CM | POA: Diagnosis not present

## 2023-03-12 DIAGNOSIS — Z419 Encounter for procedure for purposes other than remedying health state, unspecified: Secondary | ICD-10-CM | POA: Diagnosis not present

## 2023-03-28 DIAGNOSIS — Z3042 Encounter for surveillance of injectable contraceptive: Secondary | ICD-10-CM | POA: Diagnosis not present

## 2023-04-11 DIAGNOSIS — Z419 Encounter for procedure for purposes other than remedying health state, unspecified: Secondary | ICD-10-CM | POA: Diagnosis not present

## 2023-05-12 DIAGNOSIS — Z419 Encounter for procedure for purposes other than remedying health state, unspecified: Secondary | ICD-10-CM | POA: Diagnosis not present

## 2023-05-29 DIAGNOSIS — S99921A Unspecified injury of right foot, initial encounter: Secondary | ICD-10-CM | POA: Diagnosis not present

## 2023-06-11 DIAGNOSIS — Z419 Encounter for procedure for purposes other than remedying health state, unspecified: Secondary | ICD-10-CM | POA: Diagnosis not present

## 2023-07-12 DIAGNOSIS — Z419 Encounter for procedure for purposes other than remedying health state, unspecified: Secondary | ICD-10-CM | POA: Diagnosis not present

## 2023-07-17 DIAGNOSIS — G5791 Unspecified mononeuropathy of right lower limb: Secondary | ICD-10-CM | POA: Diagnosis not present

## 2023-07-17 DIAGNOSIS — W3189XD Contact with other specified machinery, subsequent encounter: Secondary | ICD-10-CM | POA: Diagnosis not present

## 2023-07-17 DIAGNOSIS — S9781XD Crushing injury of right foot, subsequent encounter: Secondary | ICD-10-CM | POA: Diagnosis not present

## 2023-07-17 DIAGNOSIS — G90521 Complex regional pain syndrome I of right lower limb: Secondary | ICD-10-CM | POA: Diagnosis not present

## 2023-08-08 DIAGNOSIS — Z01419 Encounter for gynecological examination (general) (routine) without abnormal findings: Secondary | ICD-10-CM | POA: Diagnosis not present

## 2023-08-08 DIAGNOSIS — Z113 Encounter for screening for infections with a predominantly sexual mode of transmission: Secondary | ICD-10-CM | POA: Diagnosis not present

## 2023-08-08 DIAGNOSIS — Z79899 Other long term (current) drug therapy: Secondary | ICD-10-CM | POA: Diagnosis not present

## 2023-08-08 DIAGNOSIS — Z87891 Personal history of nicotine dependence: Secondary | ICD-10-CM | POA: Diagnosis not present

## 2023-08-12 DIAGNOSIS — Z419 Encounter for procedure for purposes other than remedying health state, unspecified: Secondary | ICD-10-CM | POA: Diagnosis not present

## 2023-09-07 DIAGNOSIS — M25571 Pain in right ankle and joints of right foot: Secondary | ICD-10-CM | POA: Diagnosis not present

## 2023-09-07 DIAGNOSIS — Z5329 Procedure and treatment not carried out because of patient's decision for other reasons: Secondary | ICD-10-CM | POA: Diagnosis not present

## 2023-09-09 DIAGNOSIS — Z419 Encounter for procedure for purposes other than remedying health state, unspecified: Secondary | ICD-10-CM | POA: Diagnosis not present

## 2023-10-21 DIAGNOSIS — Z419 Encounter for procedure for purposes other than remedying health state, unspecified: Secondary | ICD-10-CM | POA: Diagnosis not present

## 2023-11-20 DIAGNOSIS — Z419 Encounter for procedure for purposes other than remedying health state, unspecified: Secondary | ICD-10-CM | POA: Diagnosis not present

## 2023-12-21 DIAGNOSIS — Z419 Encounter for procedure for purposes other than remedying health state, unspecified: Secondary | ICD-10-CM | POA: Diagnosis not present

## 2023-12-26 DIAGNOSIS — N898 Other specified noninflammatory disorders of vagina: Secondary | ICD-10-CM | POA: Diagnosis not present

## 2023-12-26 DIAGNOSIS — Z113 Encounter for screening for infections with a predominantly sexual mode of transmission: Secondary | ICD-10-CM | POA: Diagnosis not present

## 2023-12-26 DIAGNOSIS — R928 Other abnormal and inconclusive findings on diagnostic imaging of breast: Secondary | ICD-10-CM | POA: Diagnosis not present

## 2024-01-20 DIAGNOSIS — Z419 Encounter for procedure for purposes other than remedying health state, unspecified: Secondary | ICD-10-CM | POA: Diagnosis not present

## 2024-01-22 DIAGNOSIS — R928 Other abnormal and inconclusive findings on diagnostic imaging of breast: Secondary | ICD-10-CM | POA: Diagnosis not present

## 2024-01-22 DIAGNOSIS — N6489 Other specified disorders of breast: Secondary | ICD-10-CM | POA: Diagnosis not present

## 2024-01-31 DIAGNOSIS — Z3042 Encounter for surveillance of injectable contraceptive: Secondary | ICD-10-CM | POA: Diagnosis not present

## 2024-01-31 DIAGNOSIS — Z3200 Encounter for pregnancy test, result unknown: Secondary | ICD-10-CM | POA: Diagnosis not present

## 2024-02-20 DIAGNOSIS — Z419 Encounter for procedure for purposes other than remedying health state, unspecified: Secondary | ICD-10-CM | POA: Diagnosis not present

## 2024-03-22 DIAGNOSIS — Z419 Encounter for procedure for purposes other than remedying health state, unspecified: Secondary | ICD-10-CM | POA: Diagnosis not present

## 2024-04-22 DIAGNOSIS — Z3042 Encounter for surveillance of injectable contraceptive: Secondary | ICD-10-CM | POA: Diagnosis not present

## 2024-05-22 DIAGNOSIS — Z419 Encounter for procedure for purposes other than remedying health state, unspecified: Secondary | ICD-10-CM | POA: Diagnosis not present

## 2024-07-08 DIAGNOSIS — Z3041 Encounter for surveillance of contraceptive pills: Secondary | ICD-10-CM | POA: Diagnosis not present

## 2024-07-08 DIAGNOSIS — Z113 Encounter for screening for infections with a predominantly sexual mode of transmission: Secondary | ICD-10-CM | POA: Diagnosis not present
